# Patient Record
Sex: Male | Born: 1970 | Race: White | Hispanic: No | Marital: Married
Health system: Southern US, Community
[De-identification: ages and names within clinical notes are randomized; demographics above are authoritative.]

---

## 2009-02-05 ENCOUNTER — Emergency Department: Payer: Self-pay | Admitting: Emergency Medicine

## 2011-07-20 ENCOUNTER — Ambulatory Visit: Payer: Self-pay | Admitting: Internal Medicine

## 2011-12-23 ENCOUNTER — Emergency Department: Payer: Self-pay | Admitting: Emergency Medicine

## 2011-12-23 LAB — CBC
HCT: 47 % (ref 40.0–52.0)
MCHC: 33.7 g/dL (ref 32.0–36.0)
MCV: 87 fL (ref 80–100)
Platelet: 256 10*3/uL (ref 150–440)
WBC: 7.5 10*3/uL (ref 3.8–10.6)

## 2011-12-23 LAB — LIPASE, BLOOD: Lipase: 113 U/L (ref 73–393)

## 2011-12-23 LAB — COMPREHENSIVE METABOLIC PANEL
Albumin: 4.4 g/dL (ref 3.4–5.0)
Alkaline Phosphatase: 71 U/L (ref 50–136)
Anion Gap: 13 (ref 7–16)
BUN: 12 mg/dL (ref 7–18)
Bilirubin,Total: 1.1 mg/dL — ABNORMAL HIGH (ref 0.2–1.0)
Chloride: 105 mmol/L (ref 98–107)
Co2: 24 mmol/L (ref 21–32)
Creatinine: 1.13 mg/dL (ref 0.60–1.30)
EGFR (African American): >60
EGFR (Non-African Amer.): >60
Osmolality: 285 (ref 275–301)
SGPT (ALT): 49 U/L
Sodium: 142 mmol/L (ref 136–145)
Total Protein: 7.5 g/dL (ref 6.4–8.2)

## 2012-01-05 ENCOUNTER — Ambulatory Visit: Payer: Self-pay | Admitting: Urology

## 2013-02-25 DIAGNOSIS — Z86018 Personal history of other benign neoplasm: Secondary | ICD-10-CM

## 2013-02-25 HISTORY — DX: Personal history of other benign neoplasm: Z86.018

## 2013-08-07 IMAGING — CR DG ABDOMEN 1V
1 series · 4 of 4 positions shown · non-contrast
Comparison: none

REASON FOR EXAM: Calculus
COMMENTS:

PROCEDURE:     DXR - DXR KIDNEY URETER BLADDER  - January 05, 2012  [DATE]
RESULT:     Soft tissue structures unremarkable. Gas pattern nonspecific. No
free air. Tiny stone in the bladder or distal left ureter cannot be excluded.

[Series 1: supine kub · 0.17mm/px · 4 of 4 slices shown]
[im 1/4]
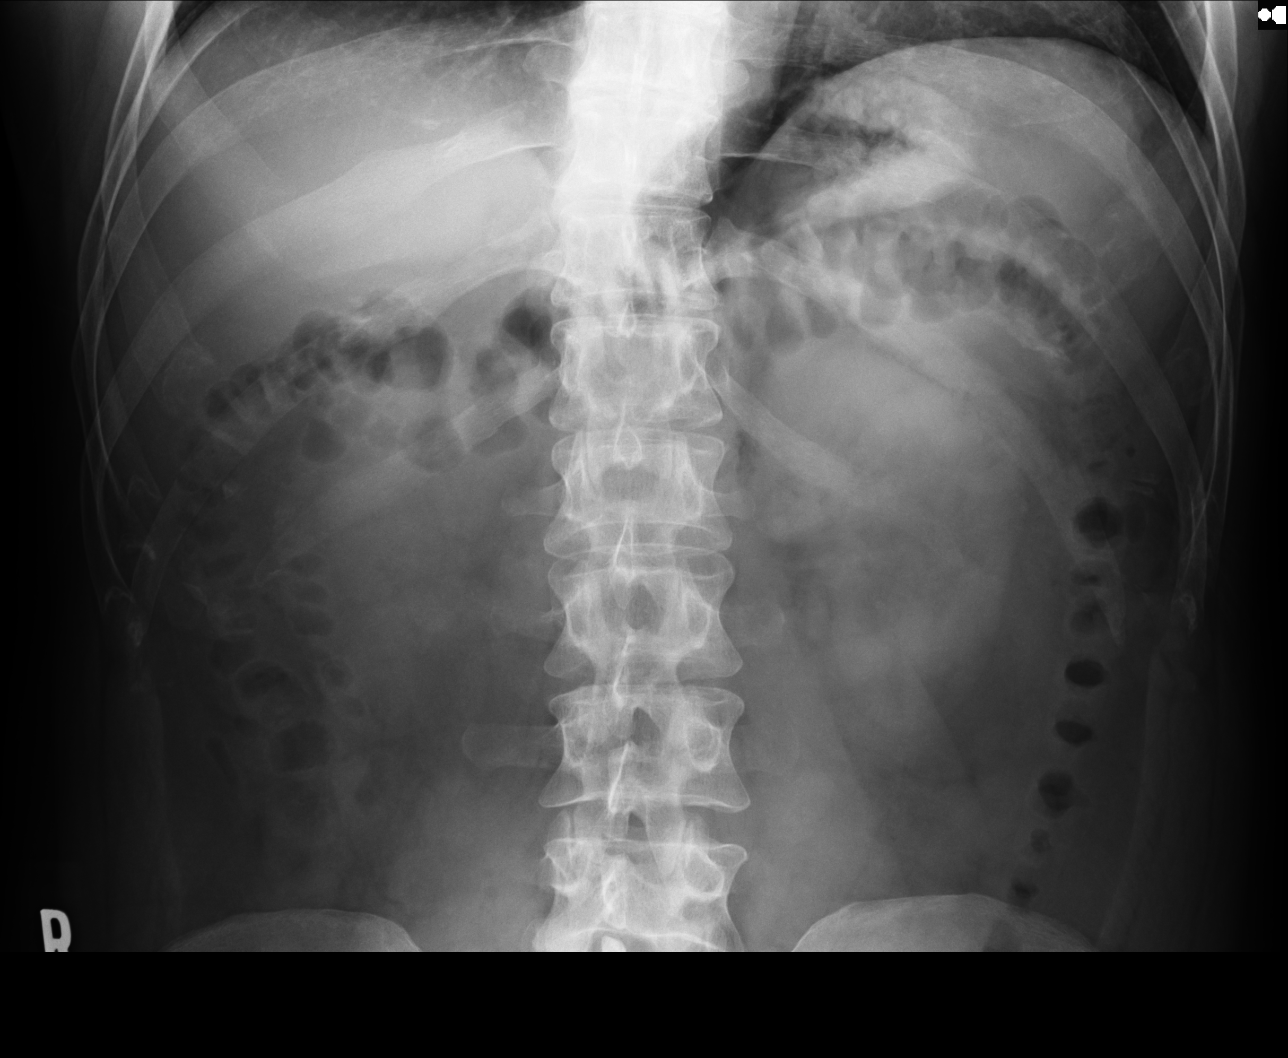
[im 2/4]
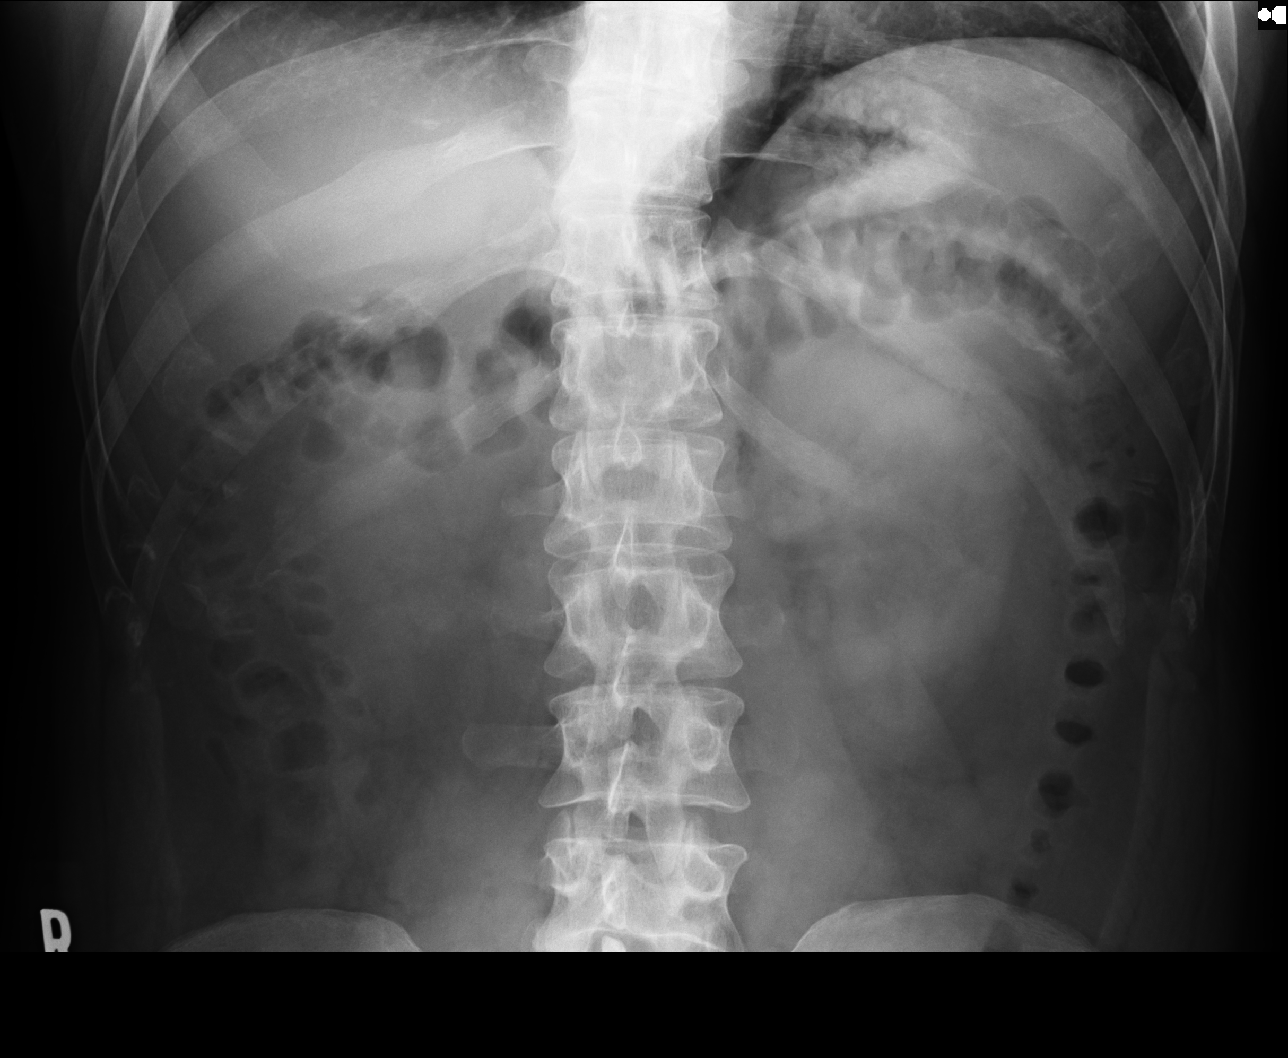
[im 3/4]
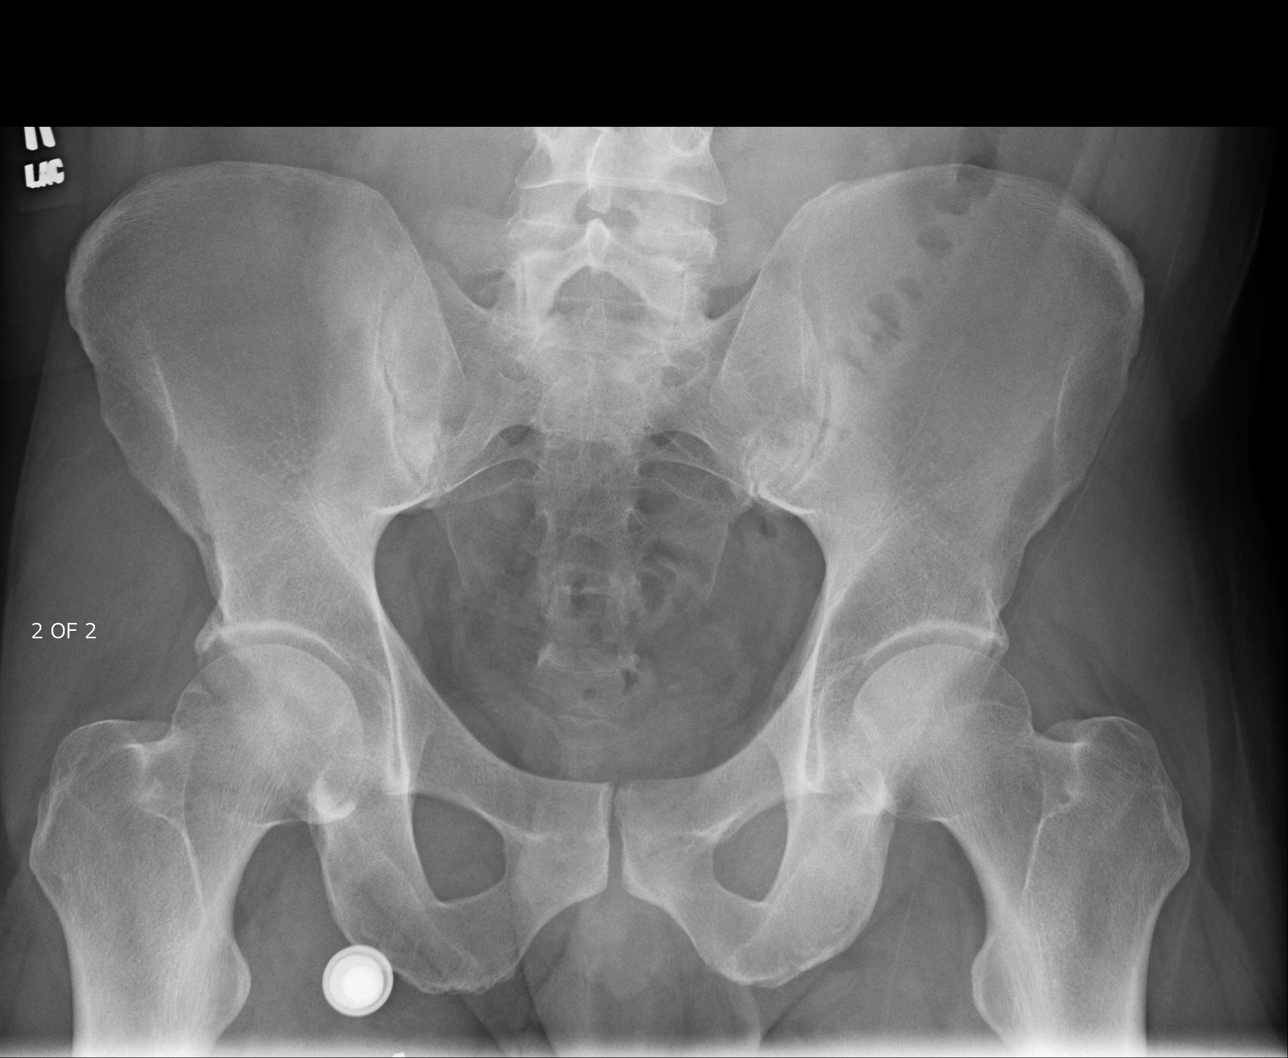
[im 4/4]
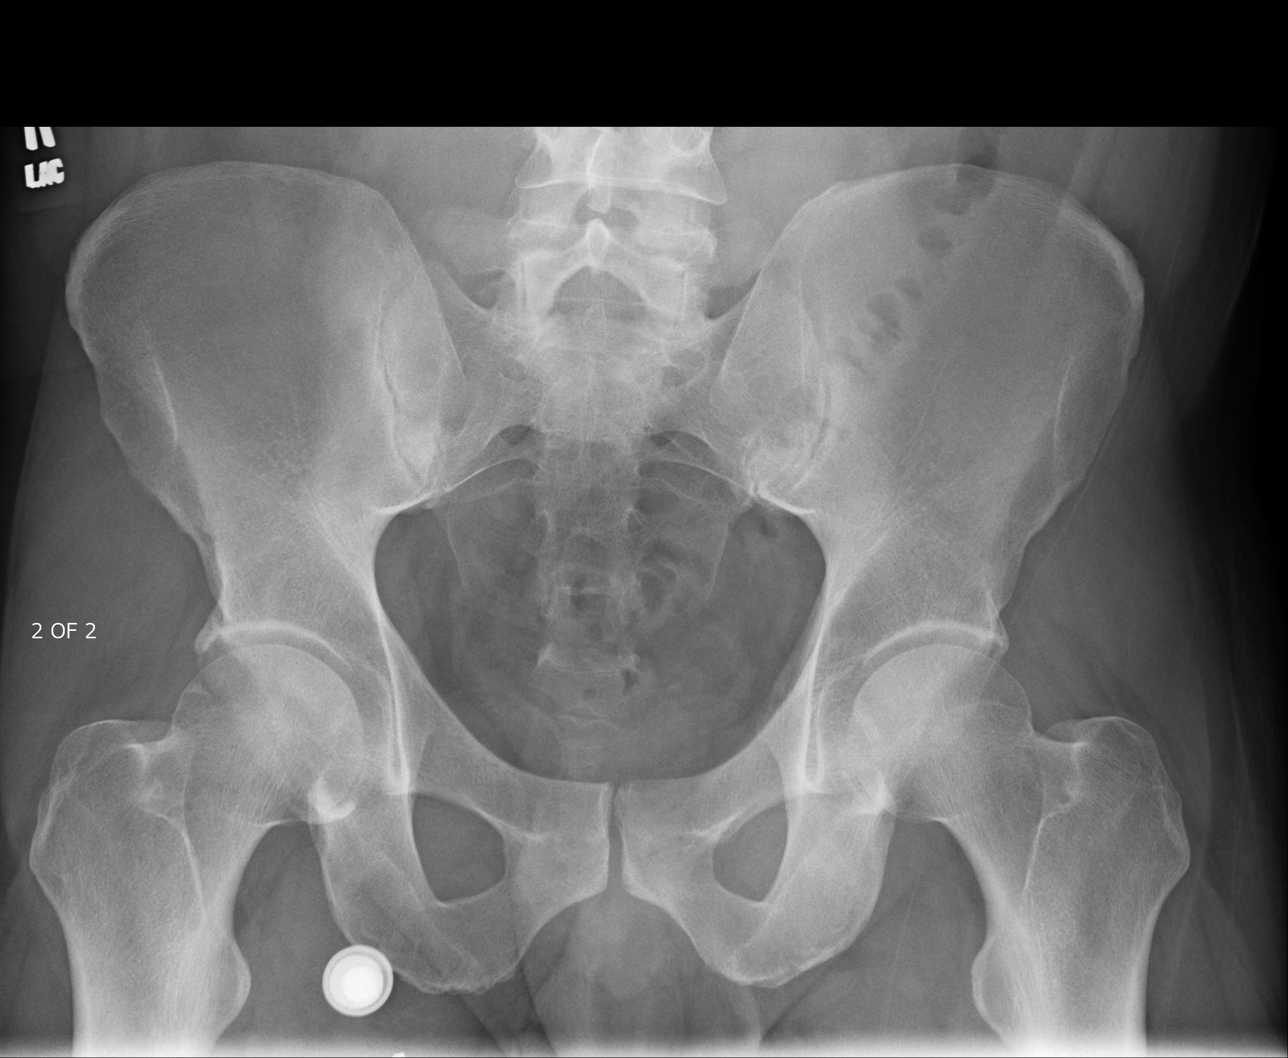

[4 of 4 positions shown; findings below may reference images not displayed]

IMPRESSION: Tiny  3 mm stone in the bladder or distal left ureter
cannot be excluded. Otherwise no acute abnormality.

## 2022-01-24 ENCOUNTER — Ambulatory Visit (INDEPENDENT_AMBULATORY_CARE_PROVIDER_SITE_OTHER): Payer: BC Managed Care – PPO | Admitting: Dermatology

## 2022-01-24 DIAGNOSIS — L814 Other melanin hyperpigmentation: Secondary | ICD-10-CM | POA: Diagnosis not present

## 2022-01-24 DIAGNOSIS — L820 Inflamed seborrheic keratosis: Secondary | ICD-10-CM

## 2022-01-24 DIAGNOSIS — L578 Other skin changes due to chronic exposure to nonionizing radiation: Secondary | ICD-10-CM

## 2022-01-24 DIAGNOSIS — L570 Actinic keratosis: Secondary | ICD-10-CM

## 2022-01-24 NOTE — Progress Notes (Signed)
   New Patient Visit  Subjective  Malik Foster is a 51 y.o. male who presents for the following: Other (Spots on scalp that his barber has recently noticed.). The patient has spots, moles and lesions to be evaluated, some may be new or changing and the patient has concerns that these could be cancer.  The following portions of the chart were reviewed this encounter and updated as appropriate:   Allergies  Meds  Problems  Med Hx  Surg Hx  Fam Hx     Review of Systems:  No other skin or systemic complaints except as noted in HPI or Assessment and Plan.  Objective  Well appearing patient in no apparent distress; mood and affect are within normal limits.  A focused examination was performed including scalp, face, ears. Relevant physical exam findings are noted in the Assessment and Plan.  left crown and left ant vertex (2) Erythematous stuck-on, waxy papule or plaque  Scalp x 1, right ear sup helix x 1 (2) Erythematous thin papules/macules with gritty scale.    Assessment & Plan   Actinic Damage - chronic, secondary to cumulative UV radiation exposure/sun exposure over time - diffuse scaly erythematous macules with underlying dyspigmentation - Recommend daily broad spectrum sunscreen SPF 30+ to sun-exposed areas, reapply every 2 hours as needed.  - Recommend staying in the shade or wearing long sleeves, sun glasses (UVA+UVB protection) and wide brim hats (4-inch brim around the entire circumference of the hat). - Call for new or changing lesions.  Lentigines - Scattered tan macules - Due to sun exposure - Benign-appering, observe - Recommend daily broad spectrum sunscreen SPF 30+ to sun-exposed areas, reapply every 2 hours as needed. - Call for any changes  Inflamed seborrheic keratosis (2) left crown and left ant vertex  Destruction of lesion - left crown and left ant vertex Complexity: simple   Destruction method: cryotherapy   Informed consent: discussed and  consent obtained   Timeout:  patient name, date of birth, surgical site, and procedure verified Lesion destroyed using liquid nitrogen: Yes   Region frozen until ice ball extended beyond lesion: Yes   Outcome: patient tolerated procedure well with no complications   Post-procedure details: wound care instructions given    AK (actinic keratosis) (2) Scalp x 1, right ear sup helix x 1  Destruction of lesion - Scalp x 1, right ear sup helix x 1 Complexity: simple   Destruction method: cryotherapy   Informed consent: discussed and consent obtained   Timeout:  patient name, date of birth, surgical site, and procedure verified Lesion destroyed using liquid nitrogen: Yes   Region frozen until ice ball extended beyond lesion: Yes   Outcome: patient tolerated procedure well with no complications   Post-procedure details: wound care instructions given     Return in about 3 months (around 04/26/2022) for AK follow up.  I, Joanie Coddington, CMA, am acting as scribe for Armida Sans, MD . Documentation: I have reviewed the above documentation for accuracy and completeness, and I agree with the above.  Armida Sans, MD

## 2022-01-24 NOTE — Patient Instructions (Addendum)
If You Need Anything After Your Visit ? ?If you have any questions or concerns for your doctor, please call our main line at [phone] and press option 4 to reach your doctor's medical assistant. If no one answers, please leave a voicemail as directed and we will return your call as soon as possible. Messages left after 4 pm will be answered the following business day.  ? ?You may also send us a message via MyChart. We typically respond to MyChart messages within 1-2 business days. ? ?For prescription refills, please ask your pharmacy to contact our office. Our fax number is [phone]. ? ?If you have an urgent issue when the clinic is closed that cannot wait until the next business day, you can page your doctor at the number below.   ? ?Please note that while we do our best to be available for urgent issues outside of office hours, we are not available 24/7.  ? ?If you have an urgent issue and are unable to reach us, you may choose to seek medical care at your doctor's office, retail clinic, urgent care center, or emergency room. ? ?If you have a medical emergency, please immediately call 911 or go to the emergency department. ? ?Pager Numbers ? ?- Dr. Kowalski: [phone] ? ?- Dr. Moye: [phone] ? ?- Dr. Stewart: [phone] ? ?In the event of inclement weather, please call our main line at [phone] for an update on the status of any delays or closures. ? ?Dermatology Medication Tips: ?Please keep the boxes that topical medications come in in order to help keep track of the instructions about where and how to use these. Pharmacies typically print the medication instructions only on the boxes and not directly on the medication tubes.  ? ?If your medication is too expensive, please contact our office at [phone] option 4 or send us a message through MyChart.  ? ?We are unable to tell what your co-pay for medications will be in advance as this is different depending on your insurance coverage.  However, we may be able to find a substitute medication at lower cost or fill out paperwork to get insurance to cover a needed medication.  ? ?If a prior authorization is required to get your medication covered by your insurance company, please allow us 1-2 business days to complete this process. ? ?Drug prices often vary depending on where the prescription is filled and some pharmacies may offer cheaper prices. ? ?The website www.goodrx.com contains coupons for medications through different pharmacies. The prices here do not account for what the cost may be with help from insurance (it may be cheaper with your insurance), but the website can give you the price if you did not use any insurance.  ?- You can print the associated coupon and take it with your prescription to the pharmacy.  ?- You may also stop by our office during regular business hours and pick up a GoodRx coupon card.  ?- If you need your prescription sent electronically to a different pharmacy, notify our office through [facility] MyChart or by phone at [phone] option 4. ? ? ? ? ?Si Usted Necesita Algo Despu?s de Su Visita ? ?Tambi?n puede enviarnos un mensaje a trav?s de MyChart. Por lo general respondemos a los mensajes de MyChart en el transcurso de 1 a 2 d?as h?biles. ? ?Para renovar recetas, por favor pida a su farmacia que se ponga en contacto con nuestra oficina. Nuestro n?mero de fax es el [phone]. ? ?Si tiene   un asunto urgente cuando la cl?nica est? cerrada y que no puede esperar hasta el siguiente d?a h?bil, puede llamar/localizar a su doctor(a) al n?mero que aparece a continuaci?n.  ? ?Por favor, tenga en cuenta que aunque hacemos todo lo posible para estar disponibles para asuntos urgentes fuera del horario de oficina, no estamos disponibles las 24 horas del d?a, los 7 d?as de la semana.  ? ?Si tiene un problema urgente y no puede comunicarse con nosotros, puede optar por buscar atenci?n m?dica  en el consultorio de su  doctor(a), en una cl?nica privada, en un centro de atenci?n urgente o en una sala de emergencias. ? ?Si tiene Radio broadcast assistant m?dica, por favor llame inmediatamente al 911 o vaya a la sala de emergencias. ? ?N?meros de b?per ? ?- Dr. Gwen Pounds: [phone] ? ?- Dra. Moye: [phone] ? ?- Dra. Roseanne Reno: [phone] ? ?En caso de inclemencias del tiempo, por favor llame a nuestra l?nea principal al [phone] para una actualizaci?n sobre el estado de cualquier retraso o cierre. ? ?Consejos para la medicaci?n en dermatolog?a: ?Por favor, guarde las cajas en las que vienen los medicamentos de uso t?pico para ayudarle a seguir las instrucciones sobre d?nde y c?mo usarlos. Las farmacias generalmente imprimen las instrucciones del medicamento s?lo en las cajas y no directamente en los tubos del [employer].  ? ?Si su medicamento es muy caro, por favor, p?ngase en contacto con Rolm Gala llamando al [phone] y presione la opci?n 4 o env?enos un mensaje a trav?s de MyChart.  ? ?No podemos decirle cu?l ser? su copago por los medicamentos por adelantado ya que esto es diferente dependiendo de la cobertura de su seguro. Sin embargo, es posible que podamos encontrar un medicamento sustituto a Audiological scientist un formulario para que el seguro cubra el medicamento que se considera necesario.  ? ?Si se requiere Neomia Dear autorizaci?n previa para que su compa??a de seguros [place] su medicamento, por favor perm?tanos de 1 a 2 d?as h?biles para completar este proceso. ? ?Los precios de los medicamentos var?an con frecuencia dependiendo del Environmental consultant de d?nde se surte la receta y alguna farmacias pueden ofrecer precios m?s baratos. ? ?El sitio web www.goodrx.com tiene cupones para medicamentos de Health and safety inspector. Los precios aqu? no tienen en cuenta lo que podr?a costar con la ayuda del seguro (puede ser m?s barato con su seguro), pero el sitio web puede darle el precio si no utiliz? ning?n seguro.  ?- Puede imprimir el cup?n  correspondiente y llevarlo con su receta a la farmacia.  ?- Tambi?n puede pasar por nuestra oficina durante el horario de atenci?n regular y recoger una tarjeta de cupones de GoodRx.  ?- Si necesita que su receta se env?e electr?nicamente a Psychiatrist, informe a nuestra oficina a trav?s de MyChart de [place] o por tel?fono llamando al [phone] y presione la opci?n 4.  ? ? ? ?If You Need Anything After Your Visit ? ?If you have any questions or concerns for your doctor, please call our main line at [phone] and press option 4 to reach your doctor's medical assistant. If no one answers, please leave a voicemail as directed and we will return your call as soon as possible. Messages left after 4 pm will be answered the following business day.  ? ?You may also send [place] a message via MyChart. We typically respond to MyChart messages within 1-2 business days. ? ?For prescription refills, please ask your pharmacy to contact our office. Our fax number is [phone]. ? ?  If you have an urgent issue when the clinic is closed that cannot wait until the next business day, you can page your doctor at the number below.   ? ?Please note that while we do our best to be available for urgent issues outside of office hours, we are not available 24/7.  ? ?If you have an urgent issue and are unable to reach [place], you may choose to seek medical care at your doctor's office, retail clinic, urgent care center, or emergency room. ? ?If you have a medical emergency, please immediately call 911 or go to the emergency department. ? ?Pager Numbers ? ?- Dr. Gwen Pounds: [phone] ? ?- Dr. Neale Burly: [phone] ? ?- Dr. Roseanne Reno: [phone] ? ?In the event of inclement weather, please call our main line at [phone] for an update on the status of any delays or closures. ? ?Dermatology Medication Tips: ?Please keep the boxes that topical medications come in in order to help keep track of the instructions about where and how  to use these. Pharmacies typically print the medication instructions only on the boxes and not directly on the medication tubes.  ? ?If your medication is too expensive, please contact our office at [phone]

## 2022-02-04 ENCOUNTER — Encounter: Payer: Self-pay | Admitting: Dermatology

## 2022-03-28 ENCOUNTER — Encounter: Payer: Self-pay | Admitting: Dermatology

## 2022-04-27 ENCOUNTER — Ambulatory Visit: Payer: BC Managed Care – PPO | Admitting: Dermatology

## 2023-03-15 ENCOUNTER — Other Ambulatory Visit: Payer: Self-pay | Admitting: Internal Medicine

## 2023-03-15 DIAGNOSIS — R0789 Other chest pain: Secondary | ICD-10-CM

## 2023-03-21 ENCOUNTER — Other Ambulatory Visit: Payer: BC Managed Care – PPO

## 2023-04-03 ENCOUNTER — Ambulatory Visit
Admission: RE | Admit: 2023-04-03 | Discharge: 2023-04-03 | Disposition: A | Discharge status: Home / Self Care | Payer: Self-pay | Source: Ambulatory Visit | Attending: Internal Medicine | Admitting: Internal Medicine

## 2023-04-03 DIAGNOSIS — R0789 Other chest pain: Secondary | ICD-10-CM | POA: Insufficient documentation
